# Patient Record
Sex: Male | Born: 2005 | Race: Black or African American | Hispanic: No | Marital: Single | Smoking: Never smoker
Health system: Southern US, Community
[De-identification: ages and names within clinical notes are randomized; demographics above are authoritative.]

## PROBLEM LIST (undated history)

## (undated) ENCOUNTER — Emergency Department (HOSPITAL_COMMUNITY): Admission: EM | Payer: Medicaid Other

## (undated) DIAGNOSIS — F909 Attention-deficit hyperactivity disorder, unspecified type: Secondary | ICD-10-CM

## (undated) DIAGNOSIS — F840 Autistic disorder: Secondary | ICD-10-CM

## (undated) DIAGNOSIS — T7840XA Allergy, unspecified, initial encounter: Secondary | ICD-10-CM

## (undated) DIAGNOSIS — F32A Depression, unspecified: Secondary | ICD-10-CM

## (undated) DIAGNOSIS — I10 Essential (primary) hypertension: Secondary | ICD-10-CM

## (undated) DIAGNOSIS — F4010 Social phobia, unspecified: Secondary | ICD-10-CM

## (undated) DIAGNOSIS — F429 Obsessive-compulsive disorder, unspecified: Secondary | ICD-10-CM

## (undated) HISTORY — DX: Obsessive-compulsive disorder, unspecified: F42.9

## (undated) HISTORY — DX: Essential (primary) hypertension: I10

## (undated) HISTORY — DX: Allergy, unspecified, initial encounter: T78.40XA

## (undated) HISTORY — DX: Depression, unspecified: F32.A

---
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
## 2023-09-07 ENCOUNTER — Ambulatory Visit: Payer: MEDICAID | Admitting: Pediatrics

## 2023-09-07 ENCOUNTER — Telehealth: Payer: Self-pay | Admitting: Pediatrics

## 2023-09-07 ENCOUNTER — Encounter: Payer: Self-pay | Admitting: Pediatrics

## 2023-09-07 VITALS — BP 120/76 | HR 94 | Ht 65.16 in | Wt 142.2 lb

## 2023-09-07 DIAGNOSIS — L700 Acne vulgaris: Secondary | ICD-10-CM | POA: Diagnosis not present

## 2023-09-07 DIAGNOSIS — K5901 Slow transit constipation: Secondary | ICD-10-CM

## 2023-09-07 DIAGNOSIS — Z113 Encounter for screening for infections with a predominantly sexual mode of transmission: Secondary | ICD-10-CM

## 2023-09-07 DIAGNOSIS — Z1331 Encounter for screening for depression: Secondary | ICD-10-CM

## 2023-09-07 DIAGNOSIS — F840 Autistic disorder: Secondary | ICD-10-CM

## 2023-09-07 DIAGNOSIS — Z68.41 Body mass index (BMI) pediatric, 5th percentile to less than 85th percentile for age: Secondary | ICD-10-CM | POA: Diagnosis not present

## 2023-09-07 DIAGNOSIS — Z0001 Encounter for general adult medical examination with abnormal findings: Secondary | ICD-10-CM | POA: Diagnosis not present

## 2023-09-07 DIAGNOSIS — Z1339 Encounter for screening examination for other mental health and behavioral disorders: Secondary | ICD-10-CM | POA: Diagnosis not present

## 2023-09-07 DIAGNOSIS — Z0000 Encounter for general adult medical examination without abnormal findings: Secondary | ICD-10-CM

## 2023-09-07 DIAGNOSIS — Z114 Encounter for screening for human immunodeficiency virus [HIV]: Secondary | ICD-10-CM | POA: Diagnosis not present

## 2023-09-07 LAB — POCT RAPID HIV: Rapid HIV, POC: NEGATIVE

## 2023-09-07 MED ORDER — POLYETHYLENE GLYCOL 3350 17 GM/SCOOP PO POWD: ORAL | 2 refills | Status: DC

## 2023-09-07 MED ORDER — RETIN-A 0.025 % EX CREA: TOPICAL_CREAM | CUTANEOUS | 0 refills | Status: DC

## 2023-09-07 MED ORDER — CLINDAMYCIN PHOS-BENZOYL PEROX 1.2-5 % EX GEL: CUTANEOUS | 3 refills | Status: AC

## 2023-09-07 NOTE — Telephone Encounter (Signed)
 Mom requested a [place] health assetment form to be filled out by the provider

## 2023-09-07 NOTE — Progress Notes (Signed)
 Adolescent Well Care Visit Randall Garner is a 18 y.o. male who is here for well care.  PCP:  Randall Chiles, MD   History was provided by the patient and mother. Randall Garner is diagnosed with Autism Spectrum Disorder and mom states she has legal guardianship through the court process.  Confidentiality was discussed with the patient and, if applicable, with caregiver as well. Patient's personal or confidential phone number: [phone]   Current Issues: Current concerns include doing well.  Mom states she followed through with obtaining legal guardianship and processing through [employer]. He is accepted with [employer] [facility]aiver (for individuals with ID/DD) and she states how that provides him with large opportunity for supports. Mom states the agency requires an updated Psychological evaluation for services; they have scheduled her with a provider in [place] due to availability.  [name] states he still has a hard time with stool passage some days and would like his Miralax refilled. No blood in stool but stays in the bathroom a long time and has mild discomfort.  Would also like refill on acne meds; states the previous meds worked fine.  Nutrition: Nutrition/Eating Behaviors: eating a variety Adequate calcium in diet?: milk in cereal Supplements/ Vitamins: takes daily vitamin  Exercise/ Media: Play any Sports?/ Exercise: goes for walks  Screen Time:  < 2 hours Media Rules or Monitoring?: no  Sleep:  Sleep: up late with brother for no specific reason. Used to take melatonin but stopped bc not helpful; he also was not sticking to good sleep hygiene routine  Social Screening: Lives with:  mom and 2 younger brothers (56 years old and 29 years old) Parental relations:  good Activities, Work, and Regulatory affairs officer?: volunteers at food give away monthly; takes care of pet cat "Randall Garner"; helps mom with laundry and cleaning Concerns regarding behavior with peers?   no Stressors of note: no  Education: School Name: He has graduated high school. 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  Hopes to return to community college with supports in place; however, [name] says he does not want to do this right away because previous experience was difficult.   Confidential Social History: Tobacco?  no Secondhand smoke exposure?  no Drugs/ETOH?  no  Sexually Active?  no   Pregnancy Prevention: abstinence  Safe at home, in school & in relationships?  Yes Safe to self?  Yes   Screenings: Patient has a dental home: need appointment  The patient completed the Rapid Assessment for Adolescent Preventive Services screening questionnaire and the following topics were identified as risk factors and discussed: exercise and school problems  In addition, the following topics were discussed as part of anticipatory guidance healthy eating, exercise, screen time, and sleep .  PHQ-9 completed and results indicated low risk; no self harm ideation noted. He stated "1" for feeling down due to experiences with officials as they went through the legal processes; states he often thought they were "disrespectful" of his mother and did not listen to her, would try to make his answer questions even when he stated preference for mom to answer.   Flowsheet Row Office Visit [facility]/07/2023 [facility]  PHQ-2 Total Score 1       Physical Exam:  Vitals:   09/07/23 1600  BP: 120/76  Pulse: 94  SpO2: 97%  Weight: 142 lb 3.2 oz (64.5 kg)  Height: 5' 5.16" (1.655 m)   BP 120/76 (BP Location: Right Arm, Patient Position: Sitting, Cuff Size: Normal)   Pulse 94   Ht 5' 5.16" (1.655 m)   Wt 142 lb 3.2 oz (64.5 kg)   SpO2 97%   BMI 23.55 kg/m  Body mass index: body mass index is  23.55 kg/m. Growth %ile SmartLinks can only be used for patients less than 58 years old.  Hearing Screening  Method: Audiometry   500Hz  1000Hz  2000Hz  4000Hz   Right ear 20 20 20 20   Left ear 20 20 20 20    Vision Screening   Right eye Left eye Both eyes  Without correction 20/40 20/40 20/25   With correction       General Appearance:   alert, oriented, no acute distress and well nourished  HENT: Normocephalic, no obvious abnormality, conjunctiva clear.  Full beard, well groomed  Mouth:   Normal appearing teeth, no obvious discoloration, dental caries, or dental caps  Neck:   Supple; thyroid: no enlargement, symmetric, no tenderness/mass/nodules  Chest Normal male  Lungs:   Clear to auscultation bilaterally, normal work of breathing  Heart:   Regular rate and rhythm, S1 and S2 normal, no murmurs;   Abdomen:   Soft, non-tender, no mass, or organomegaly  GU genitalia not examined  Musculoskeletal:   Tone and strength strong and symmetrical, all extremities               Lymphatic:   No cervical adenopathy  Skin/Hair/Nails:   Skin warm, dry and intact, no rashes, no bruises or petechiae  Neurologic:   Strength, gait, and coordination normal and age-appropriate   Results for orders placed or performed in visit on 09/07/23 (from the past 48 hours)  POCT Rapid HIV     Status: Normal   Collection Time: 09/07/23  4:30 PM  Result Value Ref Range   Rapid HIV, POC Negative      Assessment and Plan:   1. Encounter for general adult medical examination without abnormal findings (Primary) Hearing screening result:normal Vision screening result: not normal; advised to use his glasses for close work  2. Body mass index, pediatric, 5th percentile to less than 85th percentile for age Normal BMI; reviewed with [facility] and mom.  Encouraged continued efforts at healthy lifestyle habits.  3. Screening examination for venereal disease Not obtained; unable to void today.  4. Screening for human  immunodeficiency virus Negative result; repeat screening in 1 year and prn. - POCT Rapid HIV  5. Acne vulgaris Only minor comedones today. Continue gentle face wash; use acne meds as needed.  Refills below. - Clindamycin-Benzoyl Per, Refr, gel; Apply to acne lesions one or two times a day when needed  Dispense: 45 g; Refill: 3 - RETIN-A 0.025 % cream; Apply at bedtime to areas of acne  Dispense: 45 g; Refill: 0  6. Slow transit constipation Refill entered.  Continue ample fiber and water in daily dietary intake. - polyethylene glycol powder (GLYCOLAX/MIRALAX) 17 GM/SCOOP powder; Mix one capful (17 grams) in 8 ounces of liquid and drink once a day when needed to treat constipation  Dispense: 255 g; Refill: 2  7. Autism spectrum disorder Ashan is relatively high function ASD but has delays in areas like reading, decision making and others impacting his daily living and ability to live independently. Entered referral needed by pt for continued support services. - AMB Referral Child Developmental Service   Advised  on transfer to adult medicine and resources in [place]. Needs wellness visit in one year.  PRN acute care.  Randall Chiles, MD

## 2023-09-08 ENCOUNTER — Encounter: Payer: Self-pay | Admitting: *Deleted

## 2023-09-08 NOTE — Telephone Encounter (Signed)
 Randy's mother notified [facility] form/immunization record is ready for pick up.

## 2023-09-14 ENCOUNTER — Encounter: Payer: Self-pay | Admitting: Pediatrics

## 2023-09-14 NOTE — Patient Instructions (Addendum)
 Good check up today! Please check on adult medical providers to make change from this pediatrics office to either [facility] Medicine or Internal Medicine for all routine health care by next birthday.  You may wish to stay with [facility] or you may choose another favorite doctor. Most major health systems use the Epic electronic health record, making it easier for your health information to follow you from pediatrics to adult medicine.  Please check out [email or web address] The site will provide you with information on many of the awesome doctors in [facility] and you can call to ask if they are accepting new patients. Here are a few practices from that site that are family medicine and call see the entire family:  [facility] Address: [address] [place], [place], [place] [address] Phone: [phone]  [facility] [address] [place],  [place]  [address] Main: [phone]  [facility] [address] [place],  [place]  [address] Main: [phone]   Keep your currently scheduled appointment for the Psychological Evaluation; if we are able to get you a closer appointment, you can then contact that out of town center to cancel.  For constipation/hard stools: Please try to eat 2 fruits, 3 non-starchy vegetables daily and drink water several times throughout the day. Get some exercise daily - walking, playing with your brothers, helping mom with housework and shopping all count as part of your exercise. The the Miralax if you are having hard stool of go 2 days with no stool - mix 1 capful in 8 ounces of liquid (water is best but juice, sports drink are okay too).  Follow with another cup of water.  You can use every day if needed. Cut back on use if stools are too loose.   For acne: Wash face morning and night with a mild cleanser like Dove for Sensitive Skin, Cetaphil or  CeraVe. These products are also safe to use in washing your beard; however, you may choose to wash your face in the shower to be sure to rinse out all soap from your beard. Apply the Retin-A cream to acne only at night.  Start with use only 2 times a week; if tolerates ok, increase to every other night.  If still not causing redness or stinging, you can increase to every night or remain at every other night. You can use the clindamycin-benzoyl peroxide gel to acne areas day or night.  Let this dry well before dressing or lying down because this medication can leave white spots on colored fabrics due to bleach effect of the benzoyl peroxide. Use SPF 30 or more to your face every day to prevent sunburn - the Retin-A makes you more likely to have redness and sunburn.      These are just examples of what you may select

## 2023-10-28 ENCOUNTER — Telehealth: Payer: Self-pay | Admitting: Pediatrics

## 2023-10-28 DIAGNOSIS — Z0101 Encounter for examination of eyes and vision with abnormal findings: Secondary | ICD-10-CM

## 2023-10-28 NOTE — Telephone Encounter (Signed)
 Good afternoon,  Parent requested referral to the Ophthalmology. Patient would like to be seen somewhere that accepts his insurance medicaid.  Thanks,

## 2023-11-02 NOTE — Telephone Encounter (Signed)
 Referral placed.

## 2024-01-23 ENCOUNTER — Encounter: Payer: Self-pay | Admitting: Pediatrics

## 2024-01-23 ENCOUNTER — Ambulatory Visit (INDEPENDENT_AMBULATORY_CARE_PROVIDER_SITE_OTHER): Payer: MEDICAID | Admitting: Pediatrics

## 2024-01-23 VITALS — BP 108/92 | Ht 67.02 in | Wt 155.0 lb

## 2024-01-23 DIAGNOSIS — F902 Attention-deficit hyperactivity disorder, combined type: Secondary | ICD-10-CM | POA: Diagnosis not present

## 2024-01-23 DIAGNOSIS — G479 Sleep disorder, unspecified: Secondary | ICD-10-CM

## 2024-01-23 DIAGNOSIS — F840 Autistic disorder: Secondary | ICD-10-CM

## 2024-01-23 DIAGNOSIS — F419 Anxiety disorder, unspecified: Secondary | ICD-10-CM

## 2024-01-23 DIAGNOSIS — K5901 Slow transit constipation: Secondary | ICD-10-CM

## 2024-01-23 NOTE — Patient Instructions (Addendum)
 Plan to get up at 7 Am - set your alarm if needed Stay up until at 2 pm, then it is okay to take a nap for not more than 1 hour  Plan to get ready for bed by 11 pm and asleep by midnight - ok to listen to music but no talking and texting or videos.  Can add 3 mg Melatonin at 11 pm to help with sleep  ____________________________  Breakfast, lunch and dinner - some protein each meal:  Yogurt, eggs, meat, peanut butter, beans Have fruit 2 or 3 times a day, green vegetable 2 times a day. Banana only a couple of times a day Lots of water.  Take the Miralax  to help with stools _____________________________________  1st choice is [employer] - weekday only 2nd choice is [employer] - first of the week only  ---------------------------------------------------  You will get a call about Audiology  Also, will schedule you with [facility] about helping with stress

## 2024-01-23 NOTE — Progress Notes (Unsigned)
 Subjective:    Patient ID: Dearis Danis, male    DOB: [birth year], 18 y.o.   MRN: [id number]  HPI Chief Complaint  Patient presents with   Behavior concerns    Mom is concerned that he is not sleeping well and becoming more irritable/irrate. Needs form signed for program assistance     Carver is here with concerns noted above.  He is accompanied by his mother. Alpha has Autism Spectrum Disorder and anxiety.  Mom has guardianship and his is enrolled to have supportive services for home care and work place assistance through his insurance.  Mom states Alfonse is currently allowed 12 hours for supportive employment (current covers looking for a job =/- actual work), 12 hours for community support living (mom) and 16 hours for activities in community [facility]).  He was denied the request for [employer] and Supports, with notation he did not qualify due to lack of complex needs and ability to do most ADL without assistance.  Mom questions if this can be appealed.  Stress issues related to relationship with brothers - gets mad and may have anger melt downs and looks different to mom Goes to sleep sometimes 11 pm Last night a nap then asleep around 12/1 to 6:30 am, feel asleep in the car Some nights he is up all night and sleeps days - games or music or looking  Brother works days 3 days and nights the other days Other brother is in school Mom is home with classes online  Activity:  gets out about 3 days a week (church activity 2nd and 4th Saturday  and with other volunteer services) May go walking or jogging about once a week around the block - 10 minutes   Eating food okay and can prepare some simple foods like things from the microwave or make sandwiches. Continues with constipation, painful hard stools.  He is not working.  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 He gets anxious around  lots of people or if having to do much talking. He would like something with art but has been told he needs higher education than his HS certificate to participate.  No other needs or modifying factors.  PMH, problem list, medications and allergies, family and social history reviewed and updated as indicated.   Review of Systems As noted in HPI above.    Objective:   Physical Exam Vitals and nursing note reviewed.  Constitutional:      General: He is not in acute distress.    Appearance: Normal appearance. He is normal weight.     Comments: Sits on exam table and appears a little anxious when providing information to this physician and his mom - difference in facial muscles and difference in speech flow  HENT:     Head: Normocephalic and atraumatic.     Nose: Nose normal.     Mouth/Throat:     Mouth: Mucous membranes are moist.     Pharynx: Oropharynx is clear.  Eyes:     Conjunctiva/sclera: Conjunctivae normal.  Cardiovascular:     Rate and Rhythm: Normal rate and regular rhythm.     Pulses: Normal pulses.     Heart sounds: Normal heart sounds. No murmur heard. Pulmonary:     Effort: Pulmonary effort is normal. No respiratory distress.     Breath sounds: Normal breath sounds.  Musculoskeletal:  General: Normal range of motion.  Skin:    General: Skin is warm and dry.  Neurological:     Mental Status: He is alert.     Gait: Gait normal.   Blood pressure (!) 108/92, height 5' 7.02 (1.702 m), weight 155 lb (70.3 kg).     Assessment & Plan:   1. Autism spectrum disorder   2. Attention deficit hyperactivity disorder (ADHD), combined type   3. Anxious mood   4. Sleep disturbance   5. Slow transit constipation     Mom leaves packet of materials related to his appeal; reviewed briefly and will review more thoroughly. Discussed current assessment of Everardo able to perform many activities of daily living independently is accurate; however, he cannot live alone or  remain unattended for prolonged periods due to his anxiety. Sleep disruption is further exacerbating anxiety and poor relationship with brother. Discussed reset of sleep schedule now that youngest brother is back in school and middle brother works/spends time out of home. Okay to have Melatonin 3 mg 30 min to 1 hour before bedtime to help with sleep reset. Discussed need for intentional exercise and sunlight exposure. Referral to [facility] to help with stress management.  Discussed his eating habits and need for varied diet.  Discussed continuing Miralax  as needed.  Discussed work opportunity based on what is currently available.  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  Will follow up with mom about how he does.  Mom also asked about his hearing; he passed all at his annual physical and no current indication for referral to audiology.  Jaelan and his mother participated in decision making; they asked questions and I answered to their stated satisfaction.  Family voiced agreement with today's assessment and plan of care.  I personally spent a total of 40 minutes in the care of the patient today including preparing to see the patient, getting/reviewing separately obtained history, performing a medically appropriate exam/evaluation, counseling and educating, placing orders, referring and communicating with other health care professionals, documenting clinical information in the EHR, independently interpreting results, communicating results, and coordinating care.   Jon DOROTHA Bars, MD

## 2024-03-08 ENCOUNTER — Ambulatory Visit (INDEPENDENT_AMBULATORY_CARE_PROVIDER_SITE_OTHER): Payer: MEDICAID

## 2024-03-08 VITALS — BP 114/77 | HR 80 | Temp 97.6°F | Ht 67.0 in | Wt 153.8 lb

## 2024-03-08 DIAGNOSIS — R5383 Other fatigue: Secondary | ICD-10-CM | POA: Insufficient documentation

## 2024-03-08 DIAGNOSIS — K5901 Slow transit constipation: Secondary | ICD-10-CM | POA: Diagnosis not present

## 2024-03-08 DIAGNOSIS — F840 Autistic disorder: Secondary | ICD-10-CM | POA: Diagnosis not present

## 2024-03-08 DIAGNOSIS — F909 Attention-deficit hyperactivity disorder, unspecified type: Secondary | ICD-10-CM | POA: Insufficient documentation

## 2024-03-08 DIAGNOSIS — F79 Unspecified intellectual disabilities: Secondary | ICD-10-CM | POA: Insufficient documentation

## 2024-03-08 DIAGNOSIS — Z131 Encounter for screening for diabetes mellitus: Secondary | ICD-10-CM | POA: Diagnosis not present

## 2024-03-08 DIAGNOSIS — Z136 Encounter for screening for cardiovascular disorders: Secondary | ICD-10-CM

## 2024-03-08 DIAGNOSIS — F902 Attention-deficit hyperactivity disorder, combined type: Secondary | ICD-10-CM

## 2024-03-08 MED ORDER — POLYETHYLENE GLYCOL 3350 17 GM/SCOOP PO POWD: ORAL | 2 refills | Status: AC

## 2024-03-08 NOTE — Patient Instructions (Signed)
 To find a therapist, I recommend going to:  [email or web address]  You can filter your location, insurance, and specific topic that you would like to address.

## 2024-03-08 NOTE — Assessment & Plan Note (Signed)
 Diagnosed in 2010 by [facility]. Recently evaluated by psychologist in April 2025 with confirmation of ASD diagnosis as well as IDD, ADHD, and compulsive behavior. Patient is independent in majority of his ADLs, but does have intellectual and speech impairment. Lives with his mom and brothers. Graduated from high school, not currently working. Patient and his mother are working on obtaining Medicaid coverage for services. - Signed FL2 form today - Provided Rx for noise cancelling headphones as requested by case worker - Speech referral placed given hx of speech impairment - Follow up in 1 month

## 2024-03-08 NOTE — Progress Notes (Signed)
 New patient visit   Patient: Randall Garner   DOB: 2005-07-29   18 y.o. Male  MRN: [id number] Visit Date: 03/08/2024  Today's healthcare provider: Isaiah DELENA Pepper, MD   Chief Complaint  Patient presents with   New Patient (Initial Visit)    -Patient is here to establish care with primary provider -Has psychological evaluation from 09/19/2023 -Prescription for noise cancellation headphones diagnosis of sensory processing difficultly/disorder   Subjective    Randall Garner is a 18 y.o. male who presents today as a new patient to establish care.   HPI:  Autism - patient's mother has been trying to get more services for patient through Medicaid - previously involved in school programming but now finished high school - concern about speech, previously involved in speech therapy - has language and intellectual impairment - evaluated by psychologist in April, provided documentation about patient's condition to help with Medicaid coverage - needs FL2 form filled out - patient is involved in his church - not currently employed - working with Child psychotherapist now for coverage of services  Fatigue - patient has been more withdrawn, reports low energy - not as social as he once was - concerned about potential vitamin deficiency  Past Medical History:  Diagnosis Date   ADHD    Allergy     Autism    Compulsive disorder    Depression    Hypertension    Social anxiety disorder    History reviewed. No pertinent surgical history. Family Status  Relation Name Status   Brother Randall Garner Alive   Mother Randall Garner Alive   Brother Randall Garner Alive   Father  Alive  No partnership data on file   Family History  Problem Relation Age of Onset   Asthma Brother    ADD / ADHD Brother    Social History   Socioeconomic History   Marital status: Single    Spouse name: Not on file   Number of children: Not on file   Years of education: Not on file   Highest education  level: Not on file  Occupational History   Not on file  Tobacco Use   Smoking status: Never    Passive exposure: Never   Smokeless tobacco: Never  Vaping Use   Vaping status: Never Used  Substance and Sexual Activity   Alcohol use: No    Alcohol/week: 0.0 standard drinks of alcohol   Drug use: No   Sexual activity: Never  Other Topics Concern   Not on file  Social History Narrative   Lives with mother and 2 brothers. Has contact with both biological father and Mr. Randall Garner, father to sibling.   Social Drivers of Corporate investment banker Strain: Not on file  Food Insecurity: Patient Unable To Answer (01/23/2024)   Hunger Vital Sign    Worried About Running Out of Food in the Last Year: Patient unable to answer    Ran Out of Food in the Last Year: Patient unable to answer  Recent Concern: Food Insecurity - Food Insecurity Present (01/23/2024)   Hunger Vital Sign    Worried About Running Out of Food in the Last Year: Sometimes true    Ran Out of Food in the Last Year: Sometimes true  Transportation Needs: Not on file  Physical Activity: Not on file  Stress: Not on file  Social Connections: Not on file   Outpatient Medications Prior to Visit  Medication Sig   Clindamycin -Benzoyl Per, Refr,  gel Apply to acne lesions one or two times a day when needed   Melatonin 3 MG/0.9ML LIQD Take by mouth.   RETIN-A  0.025 % cream Apply at bedtime to areas of acne   [DISCONTINUED] polyethylene glycol powder (GLYCOLAX /MIRALAX ) 17 GM/SCOOP powder Mix one capful (17 grams) in 8 ounces of liquid and drink once a day when needed to treat constipation   No facility-administered medications prior to visit.   No Known Allergies  Reviews of Systems as noted in HPI.      Objective    BP 114/77 (BP Location: Right Arm, Patient Position: Sitting, Cuff Size: Normal)   Pulse 80   Temp 97.6 F (36.4 C) (Oral)   Ht 5' 7 (1.702 m)   Wt 153 lb 12.8 oz (69.8 kg)   SpO2 98%   BMI 24.09 kg/m      Physical Exam Constitutional:      Appearance: Normal appearance.  HENT:     Head: Normocephalic and atraumatic.     Mouth/Throat:     Mouth: Mucous membranes are moist.  Eyes:     Pupils: Pupils are equal, round, and reactive to light.  Pulmonary:     Effort: Pulmonary effort is normal.  Skin:    General: Skin is warm.  Neurological:     General: No focal deficit present.     Mental Status: He is alert.     Depression Screen    03/08/2024   11:27 AM 09/07/2023    4:04 PM 04/05/2020    4:41 PM  PHQ 2/9 Scores  PHQ - 2 Score 1 1 0  PHQ- 9 Score 16  6   No results found for any visits on 03/08/24.  Assessment & Plan      Problem List Items Addressed This Visit       Digestive   Slow transit constipation   Chronic, uncontrolled. Encouraged patient to take Miralax  daily and incorporate increased fiber into diet.      Relevant Medications   polyethylene glycol powder (GLYCOLAX /MIRALAX ) 17 GM/SCOOP powder     Other   Autism spectrum - Primary   Diagnosed in 2010 by [facility]. Recently evaluated by psychologist in April 2025 with confirmation of ASD diagnosis as well as IDD, ADHD, and compulsive behavior. Patient is independent in majority of his ADLs, but does have intellectual and speech impairment. Lives with his mom and brothers. Graduated from high school, not currently working. Patient and his mother are working on obtaining Medicaid coverage for services. - Signed FL2 form today - Provided Rx for noise cancelling headphones as requested by case worker - Speech referral placed given hx of speech impairment - Follow up in 1 month       Relevant Orders   Ambulatory referral to Speech Therapy   Other fatigue   Chronic, uncontrolled. Patient with difficulty falling asleep and staying asleep. Also with low energy, low mood, and trouble concentrating. May be 2/2 concurrent depression. Ddx also includes iron deficiency, vitamin D deficiency, thyroid disorder,  electrolyte disturbance, vitamin B12 deficiency. - Will obtain lab work as below - Recommend trying melatonin, will discuss insomnia more at next visit - Consider starting SSRI at next visit       Relevant Orders   CBC   Comprehensive metabolic panel with GFR   TSH   Vitamin D (25 hydroxy)   Iron, TIBC and Ferritin Panel   Vitamin B12   ADHD   Patient with hx of ADHD, inattentive type. Not  currently taking medication. Will follow up in 1 month to discuss initiating treatment.      Intellectual disability   Other Visit Diagnoses       Screening for diabetes mellitus (DM)       Relevant Orders   Hemoglobin A1c     Screening for cardiovascular condition       Relevant Orders   Lipid panel       Return in about 4 weeks (around 04/05/2024) for ADHD treatment.      Randall DELENA Pepper, MD  [facility] [phone] (phone) [phone] (fax)

## 2024-03-08 NOTE — Assessment & Plan Note (Signed)
 Patient with hx of ADHD, inattentive type. Not currently taking medication. Will follow up in 1 month to discuss initiating treatment.

## 2024-03-08 NOTE — Assessment & Plan Note (Signed)
 Chronic, uncontrolled. Patient with difficulty falling asleep and staying asleep. Also with low energy, low mood, and trouble concentrating. May be 2/2 concurrent depression. Ddx also includes iron deficiency, vitamin D deficiency, thyroid disorder, electrolyte disturbance, vitamin B12 deficiency. - Will obtain lab work as below - Recommend trying melatonin, will discuss insomnia more at next visit - Consider starting SSRI at next visit

## 2024-03-08 NOTE — Assessment & Plan Note (Signed)
 Chronic, uncontrolled. Encouraged patient to take Miralax  daily and incorporate increased fiber into diet.

## 2024-03-09 ENCOUNTER — Other Ambulatory Visit: Payer: Self-pay

## 2024-03-09 ENCOUNTER — Ambulatory Visit: Payer: Self-pay

## 2024-03-09 DIAGNOSIS — E559 Vitamin D deficiency, unspecified: Secondary | ICD-10-CM

## 2024-03-09 LAB — IRON,TIBC AND FERRITIN PANEL
Ferritin: 205 ng/mL (ref 30–400)
Iron Saturation: 26 % (ref 15–55)
Iron: 95 ug/dL (ref 38–169)
Total Iron Binding Capacity: 365 ug/dL (ref 250–450)
UIBC: 270 ug/dL (ref 111–343)

## 2024-03-09 LAB — COMPREHENSIVE METABOLIC PANEL WITH GFR
ALT: 41 IU/L (ref 0–44)
AST: 25 IU/L (ref 0–40)
Albumin: 4.8 g/dL (ref 4.3–5.2)
Alkaline Phosphatase: 106 IU/L (ref 51–125)
BUN/Creatinine Ratio: 13 (ref 9–20)
BUN: 12 mg/dL (ref 6–20)
Bilirubin Total: 0.4 mg/dL (ref 0.0–1.2)
CO2: 24 mmol/L (ref 20–29)
Calcium: 10.1 mg/dL (ref 8.7–10.2)
Chloride: 99 mmol/L (ref 96–106)
Creatinine, Ser: 0.96 mg/dL (ref 0.76–1.27)
Globulin, Total: 2.6 g/dL (ref 1.5–4.5)
Glucose: 88 mg/dL (ref 70–99)
Potassium: 4.2 mmol/L (ref 3.5–5.2)
Sodium: 140 mmol/L (ref 134–144)
Total Protein: 7.4 g/dL (ref 6.0–8.5)
eGFR: 116 mL/min/1.73 (ref >59)

## 2024-03-09 LAB — CBC
Hematocrit: 49.4 % (ref 37.5–51.0)
Hemoglobin: 16.3 g/dL (ref 13.0–17.7)
MCH: 28.9 pg (ref 26.6–33.0)
MCHC: 33 g/dL (ref 31.5–35.7)
MCV: 88 fL (ref 79–97)
Platelets: 288 x10E3/uL (ref 150–450)
RBC: 5.64 x10E6/uL (ref 4.14–5.80)
RDW: 12.2 % (ref 11.6–15.4)
WBC: 6.3 x10E3/uL (ref 3.4–10.8)

## 2024-03-09 LAB — VITAMIN B12: Vitamin B-12: 384 pg/mL (ref 232–1245)

## 2024-03-09 LAB — LIPID PANEL
Chol/HDL Ratio: 4.3 ratio (ref 0.0–5.0)
Cholesterol, Total: 193 mg/dL (ref 100–199)
HDL: 45 mg/dL (ref >39)
LDL Chol Calc (NIH): 133 mg/dL — ABNORMAL HIGH (ref 0–99)
Triglycerides: 80 mg/dL (ref 0–149)
VLDL Cholesterol Cal: 15 mg/dL (ref 5–40)

## 2024-03-09 LAB — TSH: TSH: 1.47 u[IU]/mL (ref 0.450–4.500)

## 2024-03-09 LAB — VITAMIN D 25 HYDROXY (VIT D DEFICIENCY, FRACTURES): Vit D, 25-Hydroxy: 11.4 ng/mL — ABNORMAL LOW (ref 30.0–100.0)

## 2024-03-09 LAB — HEMOGLOBIN A1C
Est. average glucose Bld gHb Est-mCnc: 114 mg/dL
Hgb A1c MFr Bld: 5.6 % (ref 4.8–5.6)

## 2024-03-09 MED ORDER — VITAMIN D (ERGOCALCIFEROL) 1.25 MG (50000 UNIT) PO CAPS: 50000.0000 [IU] | ORAL_CAPSULE | ORAL | 0 refills | Status: DC

## 2024-03-30 NOTE — Progress Notes (Signed)
 My chart message also sent.

## 2024-04-05 ENCOUNTER — Ambulatory Visit: Payer: MEDICAID

## 2024-04-05 NOTE — Progress Notes (Deleted)
      Established patient visit   Patient: Randall Garner   DOB: 2006/02/29   18 y.o. Male  MRN: [id number] Visit Date: 04/05/2024  Today's healthcare provider: Isaiah DELENA Pepper, MD   No chief complaint on file.  Subjective    HPI  Discussed the use of AI scribe software for clinical note transcription with the patient, who gave verbal consent to proceed.  History of Present Illness      Medications: Outpatient Medications Prior to Visit  Medication Sig   Clindamycin -Benzoyl Per, Refr, gel Apply to acne lesions one or two times a day when needed   Melatonin 3 MG/0.9ML LIQD Take by mouth.   polyethylene glycol powder (GLYCOLAX /MIRALAX ) 17 GM/SCOOP powder Mix one capful (17 grams) in 8 ounces of liquid and drink once a day when needed to treat constipation   RETIN-A  0.025 % cream Apply at bedtime to areas of acne   Vitamin D, Ergocalciferol, (DRISDOL) 1.25 MG (50000 UNIT) CAPS capsule Take 1 capsule (50,000 Units total) by mouth every 7 (seven) days.   No facility-administered medications prior to visit.    Review of Systems as noted in HPI.  {Insert previous labs (optional):23779} {See past labs  Heme  Chem  Endocrine  Serology  Results Review (optional):1}   Objective    There were no vitals taken for this visit. {Insert last BP/Wt (optional):23777}{See vitals history (optional):1}  Physical Exam   No results found for any visits on 04/05/24.  Assessment & Plan     Problem List Items Addressed This Visit   None   Assessment and Plan Assessment & Plan       No follow-ups on file.       Isaiah DELENA Pepper, MD  [facility] [phone] (phone) [phone] (fax)

## 2024-06-28 ENCOUNTER — Telehealth: Payer: Self-pay | Admitting: Pediatrics

## 2024-06-28 NOTE — Telephone Encounter (Signed)
 A medical records request was received from [facility] health and forwarded to the HIM Department - ROI Team.

## 2024-06-29 ENCOUNTER — Ambulatory Visit: Payer: Self-pay

## 2024-06-29 ENCOUNTER — Ambulatory Visit: Admission: RE | Admit: 2024-06-29 | Discharge: 2024-06-29 | Disposition: A | Payer: MEDICAID | Source: Ambulatory Visit

## 2024-06-29 ENCOUNTER — Ambulatory Visit (INDEPENDENT_AMBULATORY_CARE_PROVIDER_SITE_OTHER): Payer: MEDICAID

## 2024-06-29 ENCOUNTER — Ambulatory Visit: Admission: RE | Admit: 2024-06-29 | Discharge: 2024-06-29 | Disposition: A | Payer: MEDICAID

## 2024-06-29 VITALS — BP 119/66 | HR 115 | Temp 98.7°F | Wt 151.4 lb

## 2024-06-29 DIAGNOSIS — L700 Acne vulgaris: Secondary | ICD-10-CM

## 2024-06-29 DIAGNOSIS — M25562 Pain in left knee: Secondary | ICD-10-CM | POA: Diagnosis present

## 2024-06-29 DIAGNOSIS — E559 Vitamin D deficiency, unspecified: Secondary | ICD-10-CM | POA: Diagnosis not present

## 2024-06-29 DIAGNOSIS — F840 Autistic disorder: Secondary | ICD-10-CM

## 2024-06-29 MED ORDER — RETIN-A 0.025 % EX CREA: TOPICAL_CREAM | CUTANEOUS | 1 refills | Status: AC

## 2024-06-29 NOTE — Progress Notes (Signed)
 "     Acute visit   Patient: Randall Garner   DOB: 2006/05/16   19 y.o. Male  MRN: [id number] PCP: Franchot Randall LABOR, MD   Chief Complaint  Patient presents with   Fall    Slipped on ice and hit his left knee and left arm Was given some ibuprofen  and a lidocaine patch. No swelling    Subjective    Discussed the use of AI scribe software for clinical note transcription with the patient, who gave verbal consent to proceed.  History of Present Illness Randall Garner is a 19 year old male who presents with knee pain following a fall on ice.  Two days ago, he slipped on ice, falling forward and primarily landing on his left knee. He experiences pain and swelling in the knee. He uses lidocaine patches, which he cuts in half, and takes ibuprofen  occasionally for pain relief. No cracking sounds were heard during the fall. He has pain when the knee is touched and some swelling.  He also has pain in his elbow from the same fall, which is more pronounced with arm movement. He experiences pain in the elbow when moving it, but not when it is touched.  He has a history of vitamin D  deficiency and has been taking vitamin D  supplements for one month. His B12 levels were previously normal.  There is a discussion about his need for occupational therapy due to balance issues and a history of one leg being longer than the other. He has previously received physical and speech therapy. His mother mentions that he walks on his tippy toes and has coordination issues, which may benefit from further therapy.  Review of systems as noted in HPI.   Objective    BP 119/66   Pulse (!) 115   Temp 98.7 F (37.1 C) (Oral)   Wt 151 lb 6.4 oz (68.7 kg)   SpO2 99%   BMI 23.71 kg/m  Physical Exam Constitutional:      Appearance: Normal appearance.  HENT:     Head: Normocephalic and atraumatic.     Mouth/Throat:     Mouth: Mucous membranes are moist.  Eyes:     Pupils: Pupils are equal, round,  and reactive to light.  Pulmonary:     Effort: Pulmonary effort is normal.  Musculoskeletal:     Left upper arm: No swelling, edema, lacerations, tenderness or bony tenderness.     Left knee: Swelling and bony tenderness present. No erythema, ecchymosis or crepitus. Normal range of motion.  Skin:    General: Skin is warm.  Neurological:     General: No focal deficit present.     Mental Status: He is alert.       No results found for any visits on 06/29/24.  Assessment & Plan     Problem List Items Addressed This Visit       Musculoskeletal and Integument   Acne vulgaris   Relevant Medications   RETIN-A  0.025 % cream     Other   Autism spectrum - Primary   Relevant Orders   Ambulatory referral to Occupational Therapy   Other Visit Diagnoses       Acute pain of left knee       Relevant Orders   DG Knee Complete 4 Views Left     Vitamin D  deficiency       Relevant Orders   Vitamin D  (25 hydroxy)      Assessment & Plan Acute left knee  pain Acute left knee pain following a fall on ice, with mild edema and bony tenderness over the patella. Differential includes ligament sprain or fracture. - Ordered x-ray of the left knee to rule out fracture - Advised use of ice and elevation for the knee - Recommended ibuprofen  for inflammation and pain management  Autism spectrum disorder Ongoing management of autism spectrum disorder with a focus on support services and behavioral management. Discussion about the need for a letter to support increased service hours due to behavioral concerns and need for full support. - Will draft a letter detailing diagnosis and need for increased support services - Referred to occupational therapy for motor skills and communication concerns  Vitamin D  deficiency Previous supplementation. Plan to recheck levels to assess current status and adjust treatment as needed. - Rechecked vitamin D  levels - Will adjust vitamin D  supplementation based on  results  Acne vulgaris Acne vulgaris, managed with topical treatments. - Refilled prescription for Retin-a  cream    Meds ordered this encounter  Medications   RETIN-A  0.025 % cream    Sig: Apply at bedtime to areas of acne    Dispense:  45 g    Refill:  1     Return in about 3 months (around 09/27/2024) for Follow Up.      Randall DELENA Pepper, MD  [facility] [phone] (phone) [phone] (fax) "

## 2024-06-30 LAB — VITAMIN D 25 HYDROXY (VIT D DEFICIENCY, FRACTURES): Vit D, 25-Hydroxy: 10.9 ng/mL — ABNORMAL LOW (ref 30.0–100.0)

## 2024-07-01 MED ORDER — VITAMIN D (ERGOCALCIFEROL) 1.25 MG (50000 UNIT) PO CAPS: 50000.0000 [IU] | ORAL_CAPSULE | ORAL | 0 refills | Status: AC

## 2024-07-03 ENCOUNTER — Telehealth: Payer: Self-pay

## 2024-07-03 ENCOUNTER — Other Ambulatory Visit: Payer: Self-pay

## 2024-09-28 ENCOUNTER — Ambulatory Visit: Payer: MEDICAID
